# Patient Record
Sex: Male | Born: 1970
Health system: Southern US, Community
[De-identification: ages and names within clinical notes are randomized; demographics above are authoritative.]

## PROBLEM LIST (undated history)

## (undated) DIAGNOSIS — N419 Inflammatory disease of prostate, unspecified: Secondary | ICD-10-CM

## (undated) DIAGNOSIS — M940 Chondrocostal junction syndrome [Tietze]: Secondary | ICD-10-CM

## (undated) DIAGNOSIS — K219 Gastro-esophageal reflux disease without esophagitis: Secondary | ICD-10-CM

## (undated) DIAGNOSIS — N400 Enlarged prostate without lower urinary tract symptoms: Secondary | ICD-10-CM

## (undated) DIAGNOSIS — E039 Hypothyroidism, unspecified: Secondary | ICD-10-CM

## (undated) HISTORY — DX: Gastro-esophageal reflux disease without esophagitis: K21.9

## (undated) HISTORY — DX: Inflammatory disease of prostate, unspecified: N41.9

## (undated) HISTORY — DX: Benign prostatic hyperplasia without lower urinary tract symptoms: N40.0

## (undated) HISTORY — DX: Chondrocostal junction syndrome (tietze): M94.0

## (undated) HISTORY — DX: Hypothyroidism, unspecified: E03.9

---
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
## 2015-07-29 DIAGNOSIS — G4733 Obstructive sleep apnea (adult) (pediatric): Secondary | ICD-10-CM | POA: Diagnosis not present

## 2015-09-30 DIAGNOSIS — T63461S Toxic effect of venom of wasps, accidental (unintentional), sequela: Secondary | ICD-10-CM | POA: Diagnosis not present

## 2015-10-03 DIAGNOSIS — Z87438 Personal history of other diseases of male genital organs: Secondary | ICD-10-CM | POA: Diagnosis not present

## 2015-10-03 DIAGNOSIS — R3914 Feeling of incomplete bladder emptying: Secondary | ICD-10-CM | POA: Diagnosis not present

## 2015-10-03 DIAGNOSIS — N401 Enlarged prostate with lower urinary tract symptoms: Secondary | ICD-10-CM | POA: Diagnosis not present

## 2015-10-10 DIAGNOSIS — G4733 Obstructive sleep apnea (adult) (pediatric): Secondary | ICD-10-CM | POA: Diagnosis not present

## 2015-10-11 DIAGNOSIS — J309 Allergic rhinitis, unspecified: Secondary | ICD-10-CM | POA: Diagnosis not present

## 2015-10-26 DIAGNOSIS — G4733 Obstructive sleep apnea (adult) (pediatric): Secondary | ICD-10-CM | POA: Diagnosis not present

## 2015-11-08 DIAGNOSIS — J309 Allergic rhinitis, unspecified: Secondary | ICD-10-CM | POA: Diagnosis not present

## 2015-11-13 DIAGNOSIS — G4733 Obstructive sleep apnea (adult) (pediatric): Secondary | ICD-10-CM | POA: Diagnosis not present

## 2016-07-10 DIAGNOSIS — M549 Dorsalgia, unspecified: Secondary | ICD-10-CM | POA: Diagnosis not present

## 2016-07-10 DIAGNOSIS — N400 Enlarged prostate without lower urinary tract symptoms: Secondary | ICD-10-CM | POA: Diagnosis not present

## 2016-07-10 DIAGNOSIS — M25519 Pain in unspecified shoulder: Secondary | ICD-10-CM | POA: Diagnosis not present

## 2016-07-10 DIAGNOSIS — Z6839 Body mass index (BMI) 39.0-39.9, adult: Secondary | ICD-10-CM | POA: Diagnosis not present

## 2016-07-10 DIAGNOSIS — Z125 Encounter for screening for malignant neoplasm of prostate: Secondary | ICD-10-CM | POA: Diagnosis not present

## 2016-08-21 DIAGNOSIS — E039 Hypothyroidism, unspecified: Secondary | ICD-10-CM | POA: Diagnosis not present

## 2016-08-28 DIAGNOSIS — E039 Hypothyroidism, unspecified: Secondary | ICD-10-CM | POA: Diagnosis not present

## 2016-08-28 DIAGNOSIS — M940 Chondrocostal junction syndrome [Tietze]: Secondary | ICD-10-CM | POA: Diagnosis not present

## 2016-08-28 DIAGNOSIS — Z6841 Body Mass Index (BMI) 40.0 and over, adult: Secondary | ICD-10-CM | POA: Diagnosis not present

## 2016-08-28 DIAGNOSIS — M545 Low back pain: Secondary | ICD-10-CM | POA: Diagnosis not present

## 2016-10-02 DIAGNOSIS — Z125 Encounter for screening for malignant neoplasm of prostate: Secondary | ICD-10-CM | POA: Diagnosis not present

## 2016-10-02 DIAGNOSIS — Z13220 Encounter for screening for lipoid disorders: Secondary | ICD-10-CM | POA: Diagnosis not present

## 2016-10-02 DIAGNOSIS — E039 Hypothyroidism, unspecified: Secondary | ICD-10-CM | POA: Diagnosis not present

## 2016-10-02 DIAGNOSIS — Z0000 Encounter for general adult medical examination without abnormal findings: Secondary | ICD-10-CM | POA: Diagnosis not present

## 2016-10-09 DIAGNOSIS — Z23 Encounter for immunization: Secondary | ICD-10-CM | POA: Diagnosis not present

## 2016-10-09 DIAGNOSIS — E039 Hypothyroidism, unspecified: Secondary | ICD-10-CM | POA: Diagnosis not present

## 2016-10-09 DIAGNOSIS — Z136 Encounter for screening for cardiovascular disorders: Secondary | ICD-10-CM | POA: Diagnosis not present

## 2016-10-09 DIAGNOSIS — Z0000 Encounter for general adult medical examination without abnormal findings: Secondary | ICD-10-CM | POA: Diagnosis not present

## 2016-10-09 DIAGNOSIS — H6123 Impacted cerumen, bilateral: Secondary | ICD-10-CM | POA: Diagnosis not present

## 2016-10-16 ENCOUNTER — Encounter: Payer: Self-pay | Admitting: Internal Medicine

## 2016-11-20 ENCOUNTER — Other Ambulatory Visit: Payer: Self-pay

## 2016-11-20 ENCOUNTER — Encounter: Payer: Self-pay | Admitting: Internal Medicine

## 2016-11-20 ENCOUNTER — Encounter (INDEPENDENT_AMBULATORY_CARE_PROVIDER_SITE_OTHER): Payer: Self-pay

## 2016-11-20 ENCOUNTER — Ambulatory Visit (INDEPENDENT_AMBULATORY_CARE_PROVIDER_SITE_OTHER): Payer: BLUE CROSS/BLUE SHIELD | Admitting: Internal Medicine

## 2016-11-20 VITALS — BP 134/76 | HR 70 | Ht 67.0 in | Wt 245.0 lb

## 2016-11-20 DIAGNOSIS — R0789 Other chest pain: Secondary | ICD-10-CM

## 2016-11-20 DIAGNOSIS — Z1211 Encounter for screening for malignant neoplasm of colon: Secondary | ICD-10-CM | POA: Diagnosis not present

## 2016-11-20 DIAGNOSIS — R194 Change in bowel habit: Secondary | ICD-10-CM

## 2016-11-20 DIAGNOSIS — Z800 Family history of malignant neoplasm of digestive organs: Secondary | ICD-10-CM

## 2016-11-20 DIAGNOSIS — Z6838 Body mass index (BMI) 38.0-38.9, adult: Secondary | ICD-10-CM

## 2016-11-20 MED ORDER — NA SULFATE-K SULFATE-MG SULF 17.5-3.13-1.6 GM/177ML PO SOLN: 1.0000 | Freq: Once | ORAL | 0 refills | Status: DC

## 2016-11-20 MED ORDER — NA SULFATE-K SULFATE-MG SULF 17.5-3.13-1.6 GM/177ML PO SOLN: 1.0000 | Freq: Once | ORAL | 0 refills | Status: AC

## 2016-11-20 NOTE — Progress Notes (Signed)
HISTORY OF PRESENT ILLNESS:  Adam Levy is a 46 y.o. male, former Corporate treasurercorrections officer and [employer], who is referred by his primary care provider Dr. Duanne Guessewey with chief complaint of change in bowel habits and the need for colon cancer screening. Patient reports a 5 year history of problems with "shredded" bowel movements. No bleeding or pain. No weight loss. He also reports his uncle being diagnosed with colon cancer (advanced) at age 47. He is concerned. He wishes to have screening colonoscopy if reasonable. GI review of systems is otherwise negative. He had been having some discomfort under his left rib cage for which she was placed on a second. No reflux symptoms. Muscular sounding discomfort continues intermittently despite PPI. He wonders if he needs PPI at this point.  REVIEW OF SYSTEMS:  All non-GI ROS negative unless otherwise stated in history of present illness except for back pain, excessive urination  Past Medical History:  Diagnosis Date  . BPH (benign prostatic hyperplasia)   . Chondrocostal junction syndrome   . GERD (gastroesophageal reflux disease)   . Hypothyroidism   . Prostate infection     Past Surgical History:  Procedure Laterality Date  . CHOLECYSTECTOMY  11/1988    Social History Adam GulaMichael Levy  reports that he has never smoked. He has never used smokeless tobacco. He reports that he does not drink alcohol or use drugs.  family history includes Clotting disorder in his father; Colon cancer in his paternal uncle; Heart disease in his paternal grandfather.  Not on File     PHYSICAL EXAMINATION: Vital signs: BP 134/76   Pulse 70   Ht 5\' 7"  (1.702 m)   Wt 245 lb (111.1 kg)   BMI 38.37 kg/m   Constitutional: generally well-appearing, no acute distress Psychiatric: alert and oriented x3, cooperative Eyes: extraocular movements intact, anicteric, conjunctiva pink Mouth: oral pharynx moist, no lesions Neck: suppleWithout thyromegaly Lymph:  no lymphadenopathy Cardiovascular: heart regular rate and rhythm, no murmur Lungs: clear to auscultation bilaterally Abdomen: soft, obese, nontender, nondistended, no obvious ascites, no peritoneal signs, normal bowel sounds, no organomegaly Rectal:Deferred until colonoscopy Extremities: no clubbing cyanosis or lower extremity edema bilaterally Skin: no lesions on visible extremities Neuro: No focal deficits. Cranial nerves intact. No asterixis.    ASSESSMENT:  #1. Change in bowel habits. Nonspecific and without alarm features #2. Uncle with colon cancer at age 47 #3. Musculoskeletal lower chest pain unresponsive to PPI #4. Obesity   PLAN:  #1. Schedule colonoscopy. Was recently guidelines by the [employer] Society recommend initiating screening at age 46.The nature of the procedure, as well as the risks, benefits, and alternatives were carefully and thoroughly reviewed with the patient. Ample time for discussion and questions allowed. The patient understood, was satisfied, and agreed to proceed. #2. Molli Knockkay to stop PPI #3. Weight loss  A copy of this consultation note has been sent to Dr. Duanne Guessewey

## 2016-11-20 NOTE — Patient Instructions (Signed)

## 2016-12-07 DIAGNOSIS — E039 Hypothyroidism, unspecified: Secondary | ICD-10-CM | POA: Diagnosis not present

## 2016-12-09 DIAGNOSIS — Z6838 Body mass index (BMI) 38.0-38.9, adult: Secondary | ICD-10-CM | POA: Diagnosis not present

## 2016-12-09 DIAGNOSIS — E039 Hypothyroidism, unspecified: Secondary | ICD-10-CM | POA: Diagnosis not present

## 2017-01-22 ENCOUNTER — Encounter: Payer: BLUE CROSS/BLUE SHIELD | Admitting: Internal Medicine

## 2017-02-04 ENCOUNTER — Encounter: Payer: BLUE CROSS/BLUE SHIELD | Admitting: Internal Medicine

## 2017-02-12 DIAGNOSIS — E039 Hypothyroidism, unspecified: Secondary | ICD-10-CM | POA: Diagnosis not present

## 2017-02-19 DIAGNOSIS — R42 Dizziness and giddiness: Secondary | ICD-10-CM | POA: Diagnosis not present

## 2017-02-19 DIAGNOSIS — E039 Hypothyroidism, unspecified: Secondary | ICD-10-CM | POA: Diagnosis not present

## 2017-02-19 DIAGNOSIS — I10 Essential (primary) hypertension: Secondary | ICD-10-CM | POA: Diagnosis not present

## 2017-02-19 DIAGNOSIS — Z6838 Body mass index (BMI) 38.0-38.9, adult: Secondary | ICD-10-CM | POA: Diagnosis not present

## 2017-03-29 DIAGNOSIS — E039 Hypothyroidism, unspecified: Secondary | ICD-10-CM | POA: Diagnosis not present

## 2017-03-29 DIAGNOSIS — Z6838 Body mass index (BMI) 38.0-38.9, adult: Secondary | ICD-10-CM | POA: Diagnosis not present

## 2017-03-29 DIAGNOSIS — Z23 Encounter for immunization: Secondary | ICD-10-CM | POA: Diagnosis not present

## 2017-03-29 DIAGNOSIS — I10 Essential (primary) hypertension: Secondary | ICD-10-CM | POA: Diagnosis not present

## 2017-04-28 DIAGNOSIS — Z23 Encounter for immunization: Secondary | ICD-10-CM | POA: Diagnosis not present

## 2017-05-14 DIAGNOSIS — G4733 Obstructive sleep apnea (adult) (pediatric): Secondary | ICD-10-CM | POA: Diagnosis not present

## 2017-10-11 DIAGNOSIS — Z125 Encounter for screening for malignant neoplasm of prostate: Secondary | ICD-10-CM | POA: Diagnosis not present

## 2017-10-11 DIAGNOSIS — Z114 Encounter for screening for human immunodeficiency virus [HIV]: Secondary | ICD-10-CM | POA: Diagnosis not present

## 2017-10-11 DIAGNOSIS — E782 Mixed hyperlipidemia: Secondary | ICD-10-CM | POA: Diagnosis not present

## 2017-10-11 DIAGNOSIS — Z1329 Encounter for screening for other suspected endocrine disorder: Secondary | ICD-10-CM | POA: Diagnosis not present

## 2017-10-11 DIAGNOSIS — Z0000 Encounter for general adult medical examination without abnormal findings: Secondary | ICD-10-CM | POA: Diagnosis not present

## 2017-10-13 DIAGNOSIS — Z6839 Body mass index (BMI) 39.0-39.9, adult: Secondary | ICD-10-CM | POA: Diagnosis not present

## 2017-10-13 DIAGNOSIS — Z0000 Encounter for general adult medical examination without abnormal findings: Secondary | ICD-10-CM | POA: Diagnosis not present

## 2017-10-16 ENCOUNTER — Encounter (HOSPITAL_COMMUNITY): Payer: Self-pay | Admitting: Emergency Medicine

## 2017-10-16 ENCOUNTER — Ambulatory Visit (HOSPITAL_COMMUNITY)
Admission: EM | Admit: 2017-10-16 | Discharge: 2017-10-16 | Disposition: A | Discharge status: Home / Self Care | Payer: BLUE CROSS/BLUE SHIELD | Attending: Family Medicine | Admitting: Family Medicine

## 2017-10-16 DIAGNOSIS — S0502XA Injury of conjunctiva and corneal abrasion without foreign body, left eye, initial encounter: Secondary | ICD-10-CM | POA: Diagnosis not present

## 2017-10-16 DIAGNOSIS — H5712 Ocular pain, left eye: Secondary | ICD-10-CM | POA: Diagnosis not present

## 2017-10-16 DIAGNOSIS — H53149 Visual discomfort, unspecified: Secondary | ICD-10-CM | POA: Diagnosis not present

## 2017-10-16 MED ORDER — TOBRAMYCIN 0.3 % OP SOLN: 2.0000 [drp] | OPHTHALMIC | 0 refills | Status: DC

## 2017-10-16 MED ORDER — TETRACAINE HCL 0.5 % OP SOLN
OPHTHALMIC | Status: AC
  Filled 2017-10-16: qty 4

## 2017-10-16 MED ORDER — NAPROXEN 375 MG PO TABS
375.0000 mg | ORAL_TABLET | Freq: Two times a day (BID) | ORAL | 0 refills | Status: DC
Start: 2017-10-16 — End: 2019-09-18

## 2017-10-16 NOTE — Discharge Instructions (Addendum)
Prescribed tobrex eye drops.  Use as directed and to completion Prescribed naproxen as needed for pain control If you do not have significant improvement by Monday follow up with opthalmology  Return or go to ER if you have any new or worsening symptoms

## 2017-10-16 NOTE — ED Triage Notes (Signed)
Pt sts left eye pain after hitting with stick yesterday; pt denies vision change

## 2017-10-16 NOTE — ED Provider Notes (Signed)
[facility]    CSN: [id number] Arrival date & time: 10/16/17  1828     History   Chief Complaint Chief Complaint  Patient presents with  . Eye Pain    HPI Adam Levy is a 47 y.o. male.   Complains of left eye pain that began two days ago.  His symptoms began after hitting eye on tree branch while mowing.  He describes his pain as constant and achy in character.  He has tried OTC eye drops without relief.  His symptoms are made worse.  He denies similar symptoms in the past.  He complains of mild photophobia.  He denies vision changes, blurred vision, diplopia, photophobia, pain with eye movements, FB sensation, discharge, or itching.       Past Medical History:  Diagnosis Date  . BPH (benign prostatic hyperplasia)   . Chondrocostal junction syndrome   . GERD (gastroesophageal reflux disease)   . Hypothyroidism   . Prostate infection     There are no active problems to display for this patient.   Past Surgical History:  Procedure Laterality Date  . CHOLECYSTECTOMY  11/1988       Home Medications    Prior to Admission medications   Medication Sig Start Date End Date Taking? Authorizing Provider  levothyroxine (SYNTHROID, LEVOTHROID) 88 MCG tablet Take 88 mcg by mouth daily before breakfast.    [provider]  naproxen (NAPROSYN) 375 MG tablet Take 1 tablet (375 mg total) by mouth 2 (two) times daily with a meal. 10/16/17   Jenifer Struve, [place], PA-C  omeprazole (PRILOSEC) 40 MG capsule Take 40 mg by mouth daily.    [provider]  tamsulosin (FLOMAX) 0.4 MG CAPS capsule Take 0.4 mg by mouth daily.    [provider]  tobramycin (TOBREX) 0.3 % ophthalmic solution Place 2 drops into the left eye every 4 (four) hours. 10/16/17   Rennis Harding, PA-C    Family History Family History  Problem Relation Age of Onset  . Colon cancer Paternal Uncle   . Clotting disorder Father        blood clots  . Heart disease Paternal  Grandfather     Social History Social History   Tobacco Use  . Smoking status: Never Smoker  . Smokeless tobacco: Never Used  Substance Use Topics  . Alcohol use: No  . Drug use: No     Allergies   Patient has no known allergies.   Review of Systems Review of Systems  Constitutional: Negative for chills and fever.  Eyes: Positive for pain and redness. Negative for photophobia, discharge, itching and visual disturbance.  Respiratory: Negative for shortness of breath.   Cardiovascular: Negative for chest pain.  Gastrointestinal: Negative for abdominal pain, nausea and vomiting.     Physical Exam Triage Vital Signs ED Triage Vitals [10/16/17 1840]  Enc Vitals Group     BP 140/80     Pulse Rate 66     Resp 18     Temp 98.3 F (36.8 C)     Temp Source Oral     SpO2 100 %     Weight      Height      Head Circumference      Peak Flow      Pain Score      Pain Loc      Pain Edu?      Excl. in GC?    No data found.  Updated Vital Signs BP 140/80 (  BP Location: Right Arm)   Pulse 66   Temp 98.3 F (36.8 C) (Oral)   Resp 18   SpO2 100%   Visual Acuity Right Eye Distance: 20/20 Left Eye Distance: 20/25 Bilateral Distance: 20/20  Right Eye Near:   Left Eye Near:    Bilateral Near:     Physical Exam  Constitutional: He is oriented to person, place, and time. He appears well-developed and well-nourished. No distress.  HENT:  Head: Normocephalic and atraumatic.  Right Ear: External ear normal.  Left Ear: External ear normal.  Nose: Nose normal.  Mouth/Throat: Oropharynx is clear and moist. No oropharyngeal exudate.  Eyes: Pupils are equal, round, and reactive to light. Right eye exhibits no discharge. Left eye exhibits no discharge. Right conjunctiva is not injected. Left conjunctiva is injected. Right eye exhibits normal extraocular motion and no nystagmus. Left eye exhibits normal extraocular motion and no nystagmus.    Neck: Normal range of motion.  Neck supple.  Cardiovascular: Normal rate, regular rhythm and normal heart sounds. Exam reveals no friction rub.  No murmur heard. Pulmonary/Chest: Effort normal and breath sounds normal. No respiratory distress. He has no wheezes. He has no rales.  Musculoskeletal:  Ambulates from chair to exam table without difficulty   Lymphadenopathy:    He has no cervical adenopathy.  Neurological: He is alert and oriented to person, place, and time.  Skin: Skin is warm and dry. Capillary refill takes less than 2 seconds. He is not diaphoretic.  Psychiatric: He has a normal mood and affect. His behavior is normal. Judgment and thought content normal.  Nursing note and vitals reviewed.    UC Treatments / Results  Labs (all labs ordered are listed, but only abnormal results are displayed) Labs Reviewed - No data to display  EKG None Radiology No results found.  Procedures Procedures (including critical care time)  Medications Ordered in UC Medications - No data to display   Initial Impression / Assessment and Plan / UC Course  I have reviewed the triage vital signs and the nursing notes.  Pertinent labs & imaging results that were available during my care of the patient were reviewed by me and considered in my medical decision making (see chart for details).    Patient presents two days following eye injury.  His symptoms, PE, and fluorescein eye exam suggest corneal abrasion.  He was prescribed to tobrex eye drops and naproxen for pain control.  He was instructed to follow up with pathobiologist if he does not have significant improvement in symptoms by Monday.  Return and ER precautions given.    Final Clinical Impressions(s) / UC Diagnoses   Final diagnoses:  Abrasion of left cornea, initial encounter    ED Discharge Orders        Ordered    tobramycin (TOBREX) 0.3 % ophthalmic solution  Every 4 hours     10/16/17 1918    naproxen (NAPROSYN) 375 MG tablet  2 times daily with  meals     10/16/17 1918       Controlled Substance Prescriptions [place] Controlled Substance Registry consulted? No   Rennis Harding, [place] 10/16/17 6213

## 2018-03-10 DIAGNOSIS — G4733 Obstructive sleep apnea (adult) (pediatric): Secondary | ICD-10-CM | POA: Diagnosis not present

## 2018-06-10 DIAGNOSIS — G4733 Obstructive sleep apnea (adult) (pediatric): Secondary | ICD-10-CM | POA: Diagnosis not present

## 2018-09-14 DIAGNOSIS — G4733 Obstructive sleep apnea (adult) (pediatric): Secondary | ICD-10-CM | POA: Diagnosis not present

## 2018-12-15 DIAGNOSIS — G4733 Obstructive sleep apnea (adult) (pediatric): Secondary | ICD-10-CM | POA: Diagnosis not present

## 2019-01-03 DIAGNOSIS — G4733 Obstructive sleep apnea (adult) (pediatric): Secondary | ICD-10-CM | POA: Diagnosis not present

## 2019-03-17 DIAGNOSIS — G4733 Obstructive sleep apnea (adult) (pediatric): Secondary | ICD-10-CM | POA: Diagnosis not present

## 2019-06-19 DIAGNOSIS — G4733 Obstructive sleep apnea (adult) (pediatric): Secondary | ICD-10-CM | POA: Diagnosis not present

## 2019-09-18 ENCOUNTER — Ambulatory Visit (INDEPENDENT_AMBULATORY_CARE_PROVIDER_SITE_OTHER): Payer: BLUE CROSS/BLUE SHIELD

## 2019-09-18 ENCOUNTER — Encounter (HOSPITAL_COMMUNITY): Payer: Self-pay

## 2019-09-18 ENCOUNTER — Ambulatory Visit (HOSPITAL_BASED_OUTPATIENT_CLINIC_OR_DEPARTMENT_OTHER)
Admit: 2019-09-18 | Discharge: 2019-09-18 | Disposition: A | Discharge status: Home / Self Care | Payer: BLUE CROSS/BLUE SHIELD | Attending: Family Medicine | Admitting: Family Medicine

## 2019-09-18 ENCOUNTER — Ambulatory Visit (HOSPITAL_COMMUNITY)
Admission: EM | Admit: 2019-09-18 | Discharge: 2019-09-18 | Disposition: A | Discharge status: Home / Self Care | Payer: BLUE CROSS/BLUE SHIELD | Attending: Family Medicine | Admitting: Family Medicine

## 2019-09-18 ENCOUNTER — Other Ambulatory Visit: Payer: Self-pay

## 2019-09-18 DIAGNOSIS — M19011 Primary osteoarthritis, right shoulder: Secondary | ICD-10-CM | POA: Diagnosis not present

## 2019-09-18 DIAGNOSIS — G4733 Obstructive sleep apnea (adult) (pediatric): Secondary | ICD-10-CM | POA: Diagnosis not present

## 2019-09-18 DIAGNOSIS — M7989 Other specified soft tissue disorders: Secondary | ICD-10-CM

## 2019-09-18 DIAGNOSIS — G8929 Other chronic pain: Secondary | ICD-10-CM | POA: Insufficient documentation

## 2019-09-18 DIAGNOSIS — R2241 Localized swelling, mass and lump, right lower limb: Secondary | ICD-10-CM | POA: Insufficient documentation

## 2019-09-18 DIAGNOSIS — M7541 Impingement syndrome of right shoulder: Secondary | ICD-10-CM | POA: Diagnosis not present

## 2019-09-18 DIAGNOSIS — M25511 Pain in right shoulder: Secondary | ICD-10-CM | POA: Insufficient documentation

## 2019-09-18 MED ORDER — MELOXICAM 15 MG PO TABS: 15.0000 mg | ORAL_TABLET | Freq: Every day | ORAL | 0 refills | Status: AC

## 2019-09-18 NOTE — ED Provider Notes (Signed)
[facility]    CSN: [id number] Arrival date & time: 09/18/19  1445      History   Chief Complaint Chief Complaint  Patient presents with  . Leg Swelling    HPI Adam Levy is a 49 y.o. male.   HPI  Pleasant 49 year old gentleman here with 2 problems First he has chronic right shoulder pain.  Limited range of motion.  Keeps him awake at night.  It grinds with movement. This is been going on for many months.  No trauma or injury.  He thinks it is from chronic use. Next problem is right leg swelling.  This happened acutely yesterday.  He was out doing yard work.  He was squatting picking up weeds, doing general cleanup activities.  He did not have a prolonged sedentary time.  He is a non-smoker.  Nondrinker.  In good health except for thyroid replacement.  Never had clotting problems.  He took 2 aspirin yesterday.  Today his leg is slightly smaller.  Very little pain.  His calf feels tight.  No trauma no numbness no weakness.  Recent surgery.  No diagnosis of malignancy  Past Medical History:  Diagnosis Date  . BPH (benign prostatic hyperplasia)   . Chondrocostal junction syndrome   . GERD (gastroesophageal reflux disease)   . Hypothyroidism   . Prostate infection     There are no problems to display for this patient.   Past Surgical History:  Procedure Laterality Date  . CHOLECYSTECTOMY  11/1988       Home Medications    Prior to Admission medications   Medication Sig Start Date End Date Taking? Authorizing Provider  levothyroxine (SYNTHROID, LEVOTHROID) 88 MCG tablet Take 88 mcg by mouth daily before breakfast.    [provider]  omeprazole (PRILOSEC) 40 MG capsule Take 40 mg by mouth daily.    [provider]  tamsulosin (FLOMAX) 0.4 MG CAPS capsule Take 0.4 mg by mouth daily.    [provider]    Family History Family History  Problem Relation Age of Onset  . Colon cancer Paternal Uncle   . Clotting disorder Father          blood clots  . Heart disease Paternal Grandfather     Social History Social History   Tobacco Use  . Smoking status: Never Smoker  . Smokeless tobacco: Never Used  Substance Use Topics  . Alcohol use: No  . Drug use: No     Allergies   Patient has no known allergies.   Review of Systems Review of Systems  Cardiovascular: Positive for leg swelling.  Musculoskeletal: Positive for arthralgias.  Psychiatric/Behavioral: Positive for sleep disturbance.     Physical Exam Triage Vital Signs ED Triage Vitals  Enc Vitals Group     BP 09/18/19 1517 135/81     Pulse Rate 09/18/19 1517 75     Resp 09/18/19 1517 14     Temp 09/18/19 1517 98.4 F (36.9 C)     Temp Source 09/18/19 1517 Oral     SpO2 09/18/19 1517 98 %     Weight 09/18/19 1515 255 lb (115.7 kg)     Height --      Head Circumference --      Peak Flow --      Pain Score 09/18/19 1515 0     Pain Loc --      Pain Edu? --      Excl. in GC? --    No  data found.  Updated Vital Signs BP 135/81 (BP Location: Left Arm)   Pulse 75   Temp 98.4 F (36.9 C) (Oral)   Resp 14   Wt 115.7 kg   SpO2 98%   BMI 39.94 kg/m     Physical Exam Constitutional:      General: He is not in acute distress.    Appearance: He is well-developed. He is obese. He is not ill-appearing.  HENT:     Head: Normocephalic and atraumatic.     Mouth/Throat:     Comments: Mask in place Eyes:     Conjunctiva/sclera: Conjunctivae normal.     Pupils: Pupils are equal, round, and reactive to light.  Cardiovascular:     Rate and Rhythm: Normal rate.  Pulmonary:     Effort: Pulmonary effort is normal. No respiratory distress.  Musculoskeletal:     Right shoulder: Crepitus present. No deformity. Decreased range of motion.     Cervical back: Normal range of motion.     Comments: Right lower extremity with mild soft tissue swelling noted and the ankle and a slight increase in diameter of the calf muscle, tightness of the calf,  tenderness of the calf.  Good pulses Right shoulder demonstrates AC crepitus with motion and decreased ext rotation and abduction  Skin:    General: Skin is warm and dry.  Neurological:     General: No focal deficit present.     Mental Status: He is alert.     Gait: Gait abnormal.  Psychiatric:        Mood and Affect: Mood normal.        Behavior: Behavior normal.      UC Treatments / Results  Labs (all labs ordered are listed, but only abnormal results are displayed) Labs Reviewed - No data to display  EKG   Radiology DG Shoulder Right  Result Date: 09/18/2019 CLINICAL DATA:  Pain and crepitus EXAM: RIGHT SHOULDER - 2+ VIEW COMPARISON:  None. FINDINGS: No fracture or malalignment.  Moderate AC joint degenerative change. IMPRESSION: No acute osseous abnormality.  Moderate AC joint degenerative change Electronically Signed   By: Jasmine Pang M.D.   On: 09/18/2019 15:48   VAS [place] LOWER EXTREMITY VENOUS (DVT) (ONLY [facility])  Result Date: 09/18/2019  Lower Venous DVTStudy Indications: Swelling.  Risk Factors: None identified. Limitations: Poor ultrasound/tissue interface. Comparison Study: No prior studies. Performing Technologist: Chanda Busing RVT  Examination Guidelines: A complete evaluation includes B-mode imaging, spectral Doppler, color Doppler, and power Doppler as needed of all accessible portions of each vessel. Bilateral testing is considered an integral part of a complete examination. Limited examinations for reoccurring indications may be performed as noted. The reflux portion of the exam is performed with the patient in reverse Trendelenburg.  +---------+---------------+---------+-----------+----------+--------------+ RIGHT    CompressibilityPhasicitySpontaneityPropertiesThrombus Aging +---------+---------------+---------+-----------+----------+--------------+ CFV      Full           Yes      Yes                                  +---------+---------------+---------+-----------+----------+--------------+ SFJ      Full                                                        +---------+---------------+---------+-----------+----------+--------------+  FV Prox  Full                                                        +---------+---------------+---------+-----------+----------+--------------+ FV Mid   Full                                                        +---------+---------------+---------+-----------+----------+--------------+ FV DistalFull                                                        +---------+---------------+---------+-----------+----------+--------------+ PFV      Full                                                        +---------+---------------+---------+-----------+----------+--------------+ POP      Full           Yes      Yes                                 +---------+---------------+---------+-----------+----------+--------------+ PTV      Full                                                        +---------+---------------+---------+-----------+----------+--------------+ PERO     Full                                                        +---------+---------------+---------+-----------+----------+--------------+   +----+---------------+---------+-----------+----------+--------------+ LEFTCompressibilityPhasicitySpontaneityPropertiesThrombus Aging +----+---------------+---------+-----------+----------+--------------+ CFV Full           Yes      Yes                                 +----+---------------+---------+-----------+----------+--------------+     Summary: RIGHT: - There is no evidence of deep vein thrombosis in the lower extremity.  - No cystic structure found in the popliteal fossa.  LEFT: - No evidence of common femoral vein obstruction.  *See table(s) above for measurements and observations.    Preliminary      Procedures Procedures (including critical care time)  Medications Ordered in UC Medications - No data to display  Initial Impression / Assessment and Plan / UC Course  I have reviewed the triage vital signs and the nursing notes.  Pertinent labs & imaging results that were available during my care of the patient were reviewed by me and considered in my  medical decision making (see chart for details).     The right shoulder x-ray does show AC arthropathy.  He has symptoms consistent with rotator cuff tendinopathy.  I told him he needs anti-inflammatory medications and physical therapy to start, may need injection therapy, may need orthopedic consultation/additional imaging The right lower extremity vascular study is negative.  Patient does not have DVT.  I believe he has muscular strain that is causing the swelling.  Reassured. Final Clinical Impressions(s) / UC Diagnoses   Final diagnoses:  Localized swelling of right lower leg  Chronic right shoulder pain  Rotator cuff impingement syndrome of right shoulder     Discharge Instructions     You have a leg ultrasound scheduled today at 4 Come back here after test for discussion I will tell you about the shoulder result at that time     ED Prescriptions    None     PDMP not reviewed this encounter.   Eustace Moore, MD 09/18/19 [phone]

## 2019-09-18 NOTE — Progress Notes (Signed)
Right lower extremity venous duplex has been completed. Preliminary results can be found in CV Proc through chart review.  Results were given to Dr. Delton See.  09/18/19 4:40 PM Olen Cordial RVT

## 2019-09-18 NOTE — ED Triage Notes (Signed)
C/o swelling in the right lower extremity that started yesterday. No pain, not as swollen today. Cool and dry to touch.

## 2019-09-18 NOTE — Discharge Instructions (Addendum)
You have a leg ultrasound scheduled today at 4 Come back here after test for discussion I will tell you about the shoulder result at that time

## 2019-09-18 NOTE — Progress Notes (Signed)
Discussed with patient at time of visit.

## 2019-09-29 DIAGNOSIS — I10 Essential (primary) hypertension: Secondary | ICD-10-CM | POA: Diagnosis not present

## 2019-09-29 DIAGNOSIS — E039 Hypothyroidism, unspecified: Secondary | ICD-10-CM | POA: Diagnosis not present

## 2019-09-29 DIAGNOSIS — M7541 Impingement syndrome of right shoulder: Secondary | ICD-10-CM | POA: Diagnosis not present

## 2019-09-29 DIAGNOSIS — M19011 Primary osteoarthritis, right shoulder: Secondary | ICD-10-CM | POA: Diagnosis not present

## 2019-10-11 DIAGNOSIS — E039 Hypothyroidism, unspecified: Secondary | ICD-10-CM | POA: Diagnosis not present

## 2019-10-11 DIAGNOSIS — I10 Essential (primary) hypertension: Secondary | ICD-10-CM | POA: Diagnosis not present

## 2019-10-30 DIAGNOSIS — I10 Essential (primary) hypertension: Secondary | ICD-10-CM | POA: Diagnosis not present

## 2019-10-30 DIAGNOSIS — M7541 Impingement syndrome of right shoulder: Secondary | ICD-10-CM | POA: Diagnosis not present

## 2019-10-30 DIAGNOSIS — E039 Hypothyroidism, unspecified: Secondary | ICD-10-CM | POA: Diagnosis not present

## 2019-11-06 DIAGNOSIS — M75101 Unspecified rotator cuff tear or rupture of right shoulder, not specified as traumatic: Secondary | ICD-10-CM | POA: Diagnosis not present

## 2019-11-06 DIAGNOSIS — M25511 Pain in right shoulder: Secondary | ICD-10-CM | POA: Diagnosis not present

## 2019-11-06 DIAGNOSIS — M19019 Primary osteoarthritis, unspecified shoulder: Secondary | ICD-10-CM | POA: Diagnosis not present

## 2019-11-16 DIAGNOSIS — M25511 Pain in right shoulder: Secondary | ICD-10-CM | POA: Diagnosis not present

## 2019-11-22 DIAGNOSIS — M25511 Pain in right shoulder: Secondary | ICD-10-CM | POA: Diagnosis not present

## 2019-11-22 DIAGNOSIS — M19011 Primary osteoarthritis, right shoulder: Secondary | ICD-10-CM | POA: Diagnosis not present

## 2019-12-19 DIAGNOSIS — G4733 Obstructive sleep apnea (adult) (pediatric): Secondary | ICD-10-CM | POA: Diagnosis not present

## 2020-01-03 DIAGNOSIS — M25511 Pain in right shoulder: Secondary | ICD-10-CM | POA: Diagnosis not present

## 2020-01-03 DIAGNOSIS — M19011 Primary osteoarthritis, right shoulder: Secondary | ICD-10-CM | POA: Diagnosis not present

## 2020-01-15 DIAGNOSIS — G5712 Meralgia paresthetica, left lower limb: Secondary | ICD-10-CM | POA: Diagnosis not present

## 2020-03-14 DIAGNOSIS — E039 Hypothyroidism, unspecified: Secondary | ICD-10-CM | POA: Diagnosis not present

## 2020-03-14 DIAGNOSIS — I10 Essential (primary) hypertension: Secondary | ICD-10-CM | POA: Diagnosis not present

## 2020-03-20 DIAGNOSIS — G4733 Obstructive sleep apnea (adult) (pediatric): Secondary | ICD-10-CM | POA: Diagnosis not present

## 2020-03-20 DIAGNOSIS — M19011 Primary osteoarthritis, right shoulder: Secondary | ICD-10-CM | POA: Diagnosis not present

## 2020-03-27 DIAGNOSIS — E669 Obesity, unspecified: Secondary | ICD-10-CM | POA: Diagnosis not present

## 2020-03-27 DIAGNOSIS — E039 Hypothyroidism, unspecified: Secondary | ICD-10-CM | POA: Diagnosis not present

## 2020-03-27 DIAGNOSIS — I10 Essential (primary) hypertension: Secondary | ICD-10-CM | POA: Diagnosis not present

## 2020-03-27 DIAGNOSIS — M19019 Primary osteoarthritis, unspecified shoulder: Secondary | ICD-10-CM | POA: Diagnosis not present

## 2020-04-03 DIAGNOSIS — Z23 Encounter for immunization: Secondary | ICD-10-CM | POA: Diagnosis not present

## 2020-06-18 DIAGNOSIS — S43431A Superior glenoid labrum lesion of right shoulder, initial encounter: Secondary | ICD-10-CM | POA: Diagnosis not present

## 2020-06-18 DIAGNOSIS — G8918 Other acute postprocedural pain: Secondary | ICD-10-CM | POA: Diagnosis not present

## 2020-06-18 DIAGNOSIS — M7541 Impingement syndrome of right shoulder: Secondary | ICD-10-CM | POA: Diagnosis not present

## 2020-06-18 DIAGNOSIS — M19011 Primary osteoarthritis, right shoulder: Secondary | ICD-10-CM | POA: Diagnosis not present

## 2020-06-19 DIAGNOSIS — G4733 Obstructive sleep apnea (adult) (pediatric): Secondary | ICD-10-CM | POA: Diagnosis not present

## 2020-07-10 DIAGNOSIS — Z0000 Encounter for general adult medical examination without abnormal findings: Secondary | ICD-10-CM | POA: Diagnosis not present

## 2020-07-10 DIAGNOSIS — Z125 Encounter for screening for malignant neoplasm of prostate: Secondary | ICD-10-CM | POA: Diagnosis not present

## 2020-07-10 DIAGNOSIS — Z4789 Encounter for other orthopedic aftercare: Secondary | ICD-10-CM | POA: Diagnosis not present

## 2020-07-16 DIAGNOSIS — E0590 Thyrotoxicosis, unspecified without thyrotoxic crisis or storm: Secondary | ICD-10-CM | POA: Diagnosis not present

## 2020-07-16 DIAGNOSIS — E782 Mixed hyperlipidemia: Secondary | ICD-10-CM | POA: Diagnosis not present

## 2020-07-16 DIAGNOSIS — I10 Essential (primary) hypertension: Secondary | ICD-10-CM | POA: Diagnosis not present

## 2020-10-04 DIAGNOSIS — M25511 Pain in right shoulder: Secondary | ICD-10-CM | POA: Diagnosis not present

## 2020-10-14 DIAGNOSIS — M7501 Adhesive capsulitis of right shoulder: Secondary | ICD-10-CM | POA: Diagnosis not present

## 2021-03-14 IMAGING — DX DG SHOULDER 2+V*R*
3 series · 3 of 3 positions shown · non-contrast
Comparison: None.

CLINICAL DATA: Pain and crepitus

EXAM:
RIGHT SHOULDER - 2+ VIEW

[shoulder ap]
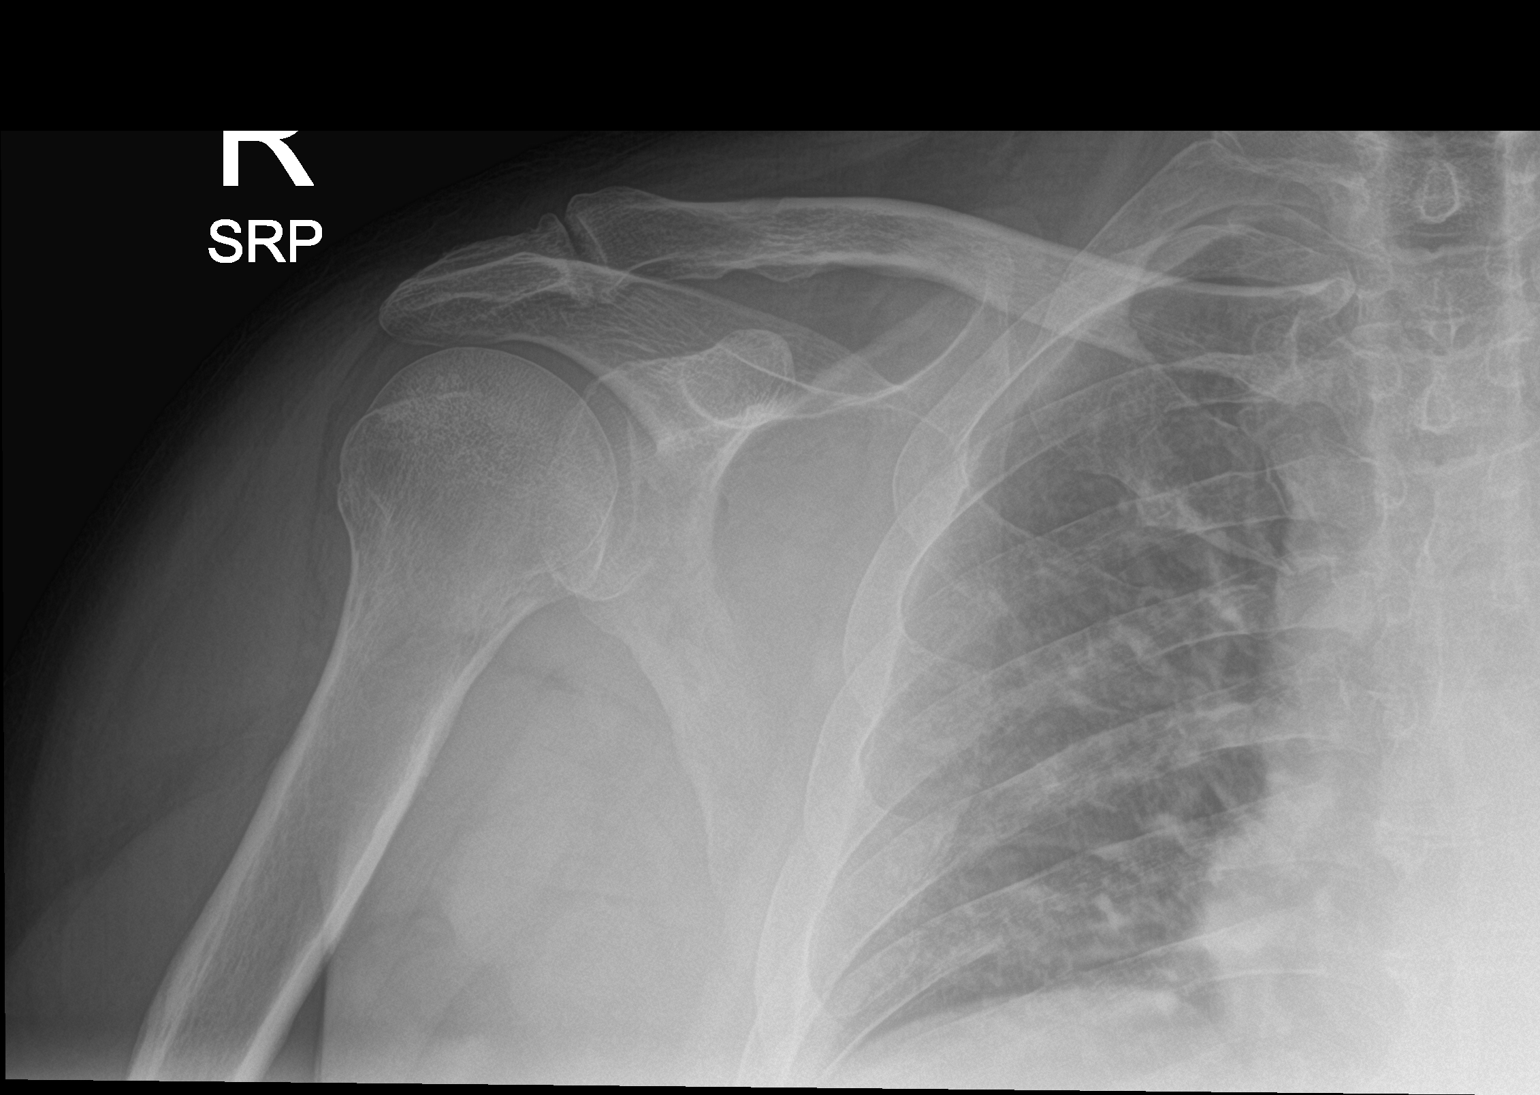

[shoulder grashey]
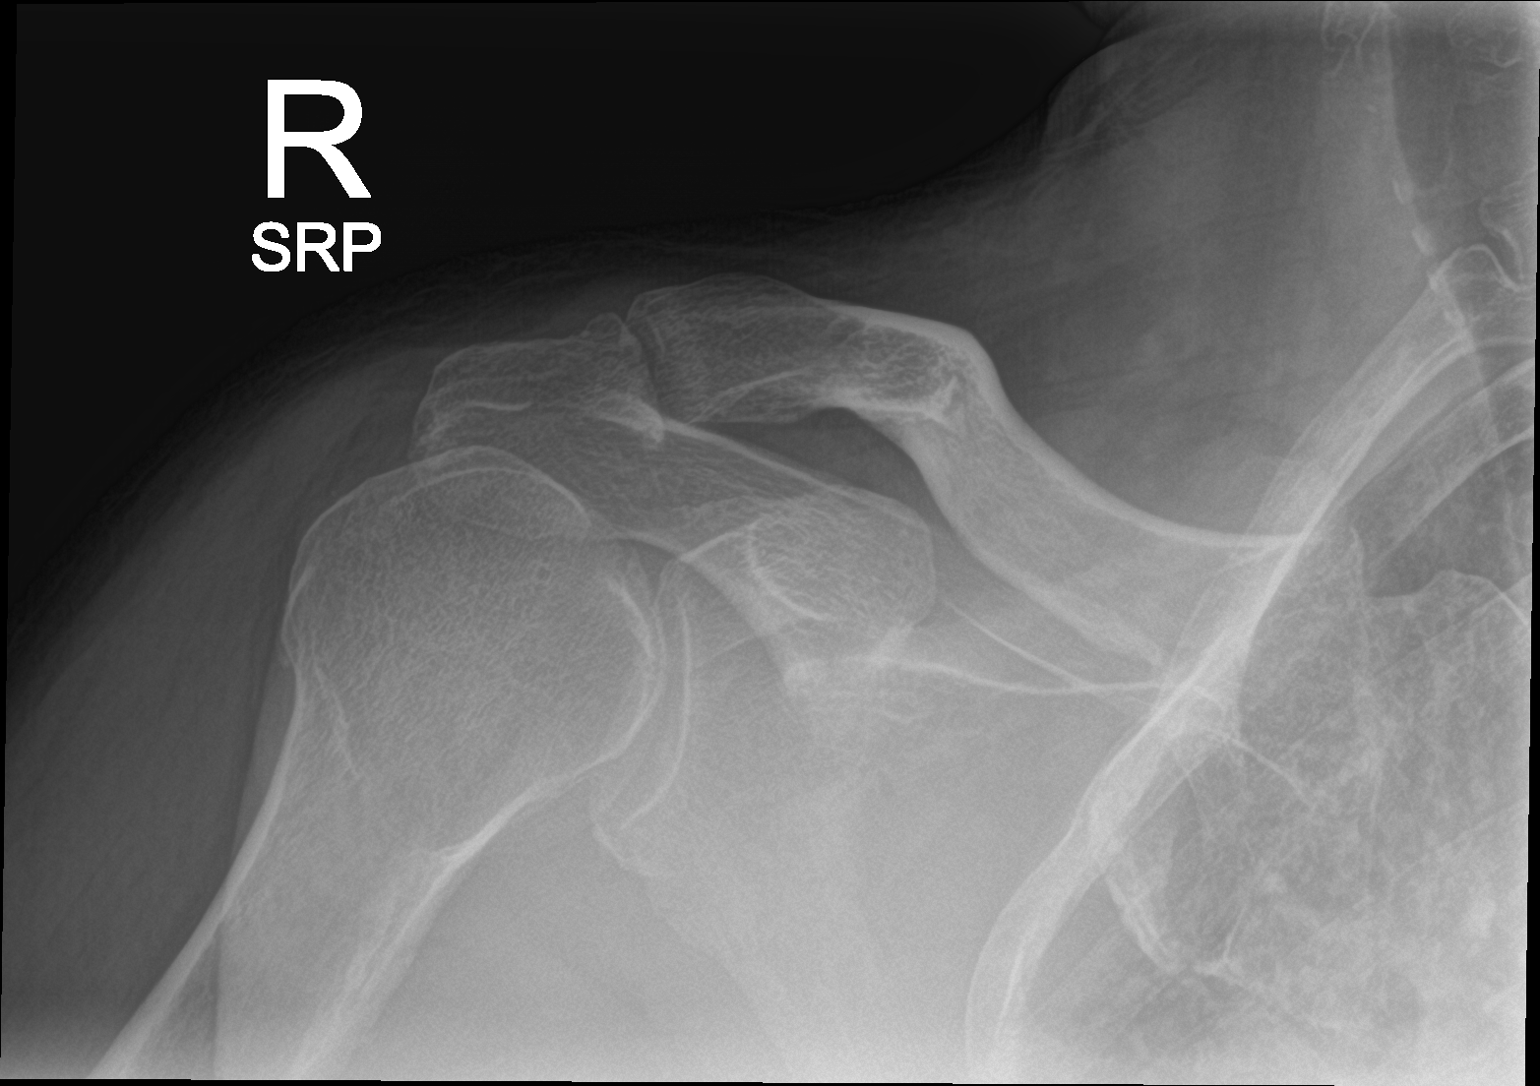

[shoulder y-view]
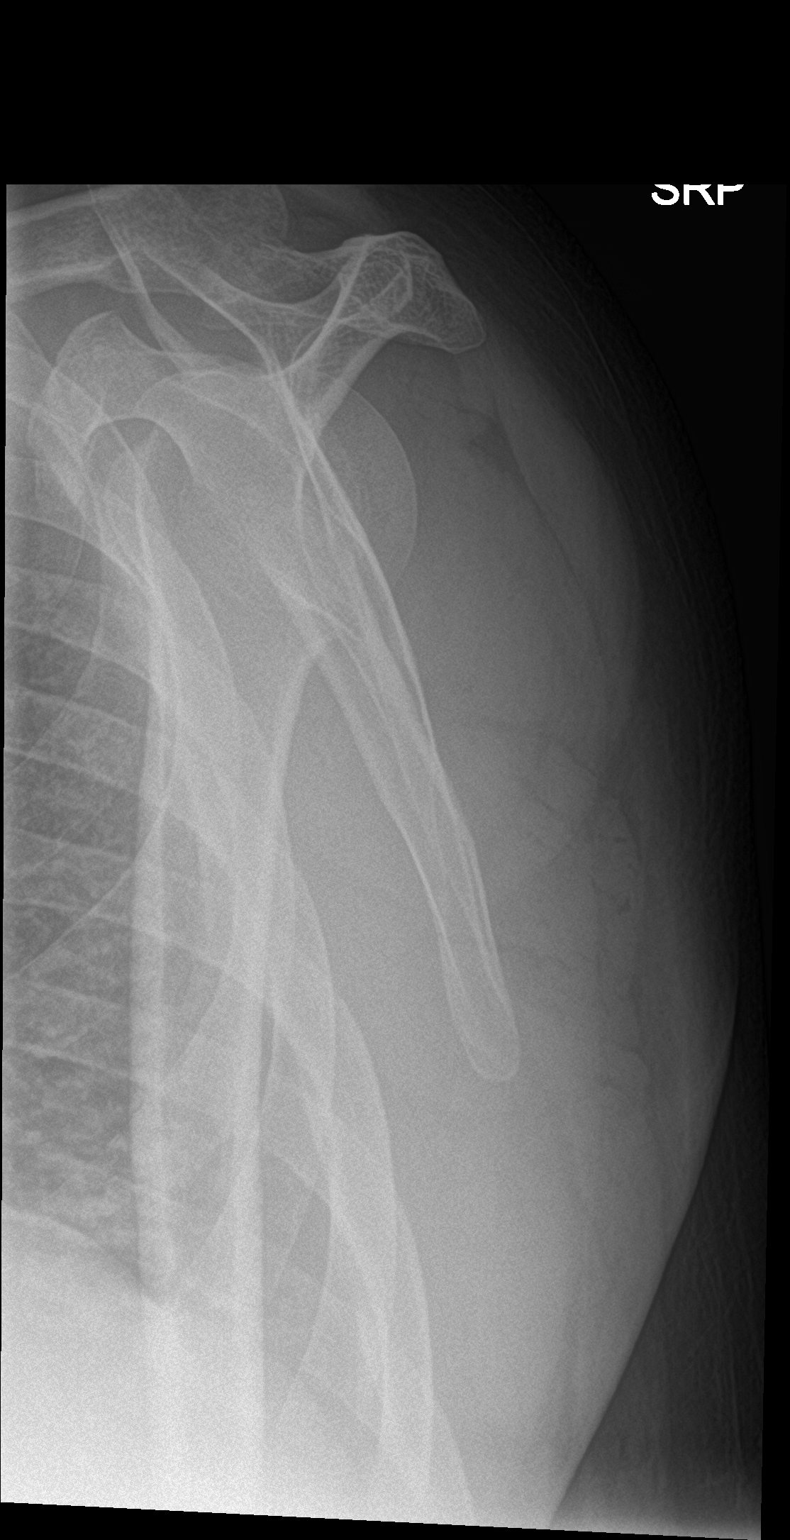

[3 of 3 positions shown; findings below may reference images not displayed]

FINDINGS: No fracture or malalignment.  Moderate AC joint degenerative change.
IMPRESSION: No acute osseous abnormality.  Moderate AC joint degenerative change
